# Patient Record
Sex: Female | Born: 1960 | Race: White | Hispanic: No | Marital: Single | Smoking: Current every day smoker
Health system: Southern US, Community
[De-identification: ages and names within clinical notes are randomized; demographics above are authoritative.]

## PROBLEM LIST (undated history)

## (undated) HISTORY — PX: ABDOMINAL HYSTERECTOMY: SHX81

---

## 2000-03-23 ENCOUNTER — Ambulatory Visit (HOSPITAL_COMMUNITY): Admission: RE | Admit: 2000-03-23 | Discharge: 2000-03-23 | Payer: Self-pay | Admitting: *Deleted

## 2011-05-14 ENCOUNTER — Other Ambulatory Visit (HOSPITAL_COMMUNITY)
Admission: RE | Admit: 2011-05-14 | Discharge: 2011-05-14 | Disposition: A | Discharge status: Home / Self Care | Payer: Self-pay | Source: Ambulatory Visit | Attending: Internal Medicine | Admitting: Internal Medicine

## 2011-05-14 ENCOUNTER — Ambulatory Visit (INDEPENDENT_AMBULATORY_CARE_PROVIDER_SITE_OTHER): Payer: Self-pay | Admitting: Internal Medicine

## 2011-05-14 ENCOUNTER — Encounter: Payer: Self-pay | Admitting: Internal Medicine

## 2011-05-14 DIAGNOSIS — N760 Acute vaginitis: Secondary | ICD-10-CM | POA: Insufficient documentation

## 2011-05-14 DIAGNOSIS — Z13220 Encounter for screening for lipoid disorders: Secondary | ICD-10-CM

## 2011-05-14 DIAGNOSIS — R634 Abnormal weight loss: Secondary | ICD-10-CM

## 2011-05-14 DIAGNOSIS — R300 Dysuria: Secondary | ICD-10-CM

## 2011-05-14 DIAGNOSIS — Z01419 Encounter for gynecological examination (general) (routine) without abnormal findings: Secondary | ICD-10-CM | POA: Insufficient documentation

## 2011-05-14 DIAGNOSIS — Z113 Encounter for screening for infections with a predominantly sexual mode of transmission: Secondary | ICD-10-CM

## 2011-05-14 DIAGNOSIS — Z124 Encounter for screening for malignant neoplasm of cervix: Secondary | ICD-10-CM

## 2011-05-14 LAB — POCT URINALYSIS DIPSTICK
Protein, UA: NEGATIVE
Spec Grav, UA: 1.025
Urobilinogen, UA: 0.2
pH, UA: 7
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
## 2011-05-14 NOTE — Patient Instructions (Signed)
Return on Monday for your bloodwork  Please consider quitting smoking again, I have printed some helpful information for  you to review.

## 2011-05-16 ENCOUNTER — Encounter: Payer: Self-pay | Admitting: Internal Medicine

## 2011-05-16 DIAGNOSIS — Z113 Encounter for screening for infections with a predominantly sexual mode of transmission: Secondary | ICD-10-CM | POA: Insufficient documentation

## 2011-05-16 DIAGNOSIS — Z124 Encounter for screening for malignant neoplasm of cervix: Secondary | ICD-10-CM | POA: Insufficient documentation

## 2011-05-16 NOTE — Assessment & Plan Note (Addendum)
Done today at patient's request. She has had a supracervical  hysterectomy
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
## 2011-05-17 ENCOUNTER — Other Ambulatory Visit (INDEPENDENT_AMBULATORY_CARE_PROVIDER_SITE_OTHER): Payer: Self-pay | Admitting: *Deleted

## 2011-05-17 DIAGNOSIS — R634 Abnormal weight loss: Secondary | ICD-10-CM

## 2011-05-17 DIAGNOSIS — Z13220 Encounter for screening for lipoid disorders: Secondary | ICD-10-CM

## 2011-05-17 NOTE — Progress Notes (Signed)
Addended by: Jobie Quaker on: 05/17/2011 02:32 PM   Modules accepted: Orders

## 2011-05-17 NOTE — Progress Notes (Signed)
Addended by: Jobie Quaker on: 05/17/2011 08:57 AM   Modules accepted: Orders

## 2011-05-17 NOTE — Progress Notes (Signed)
Addended by: Jobie Quaker on: 05/17/2011 08:52 AM   Modules accepted: Orders

## 2011-05-19 LAB — HIV ANTIBODY (ROUTINE TESTING W REFLEX): HIV: NONREACTIVE

## 2011-05-20 LAB — URINE CULTURE: Colony Count: 1000

## 2011-05-26 ENCOUNTER — Encounter: Payer: Self-pay | Admitting: Internal Medicine

## 2014-10-06 ENCOUNTER — Emergency Department: Payer: Self-pay | Admitting: Emergency Medicine

## 2015-12-09 IMAGING — CT CT ABD-PELV W/O CM
3 of 4 series · 10 of 46 positions shown, 17 images · non-contrast
Comparison: None.

CLINICAL DATA: Pt c/o n/v/d since this AM. c/o left lower abd pain.
Denies dysuria.Drank about "6 little airplane bottles" of alcohol
last night, left flank pain, Diana Mihaela sx, and denies h/o kidney stones

EXAM:
CT ABDOMEN AND PELVIS WITHOUT CONTRAST
TECHNIQUE: Multidetector CT imaging of the abdomen and pelvis was performed
following the standard protocol without IV contrast.

[Series 4: lung · axial · 0.74mm/px · z∈[-661,-561]mm · 6 of 29 slices shown, 11 images]
[im 5/29  soft-tissue]
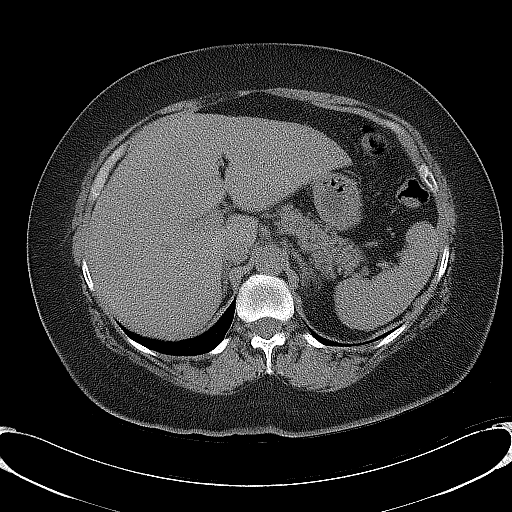
[im 5/29  bone]
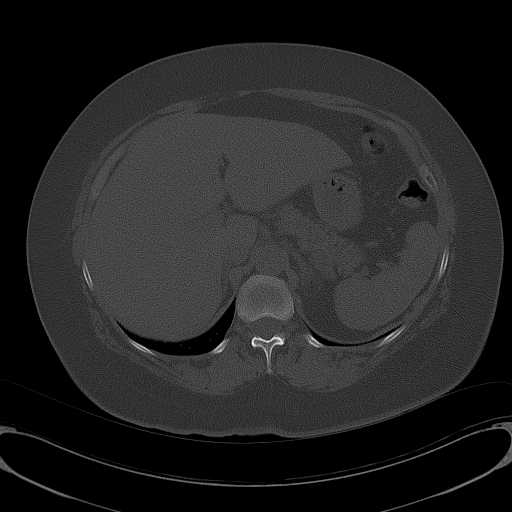
[im 9/29  soft-tissue]
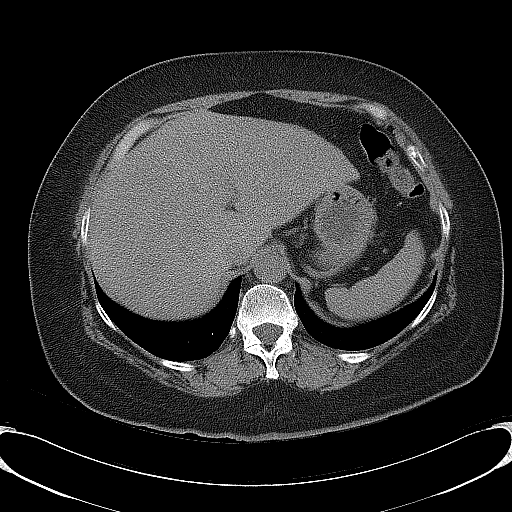
[im 13/29  soft-tissue]
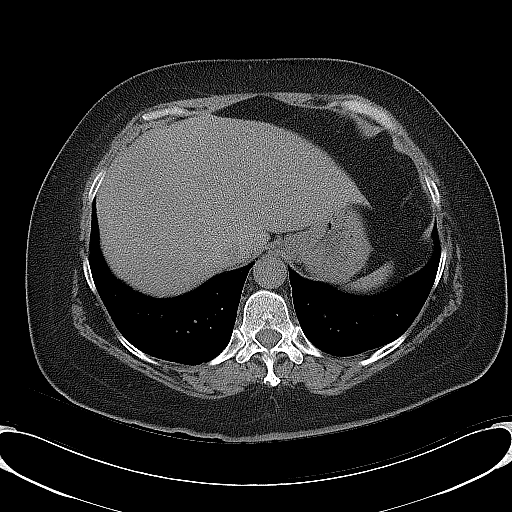
[im 13/29  lung]
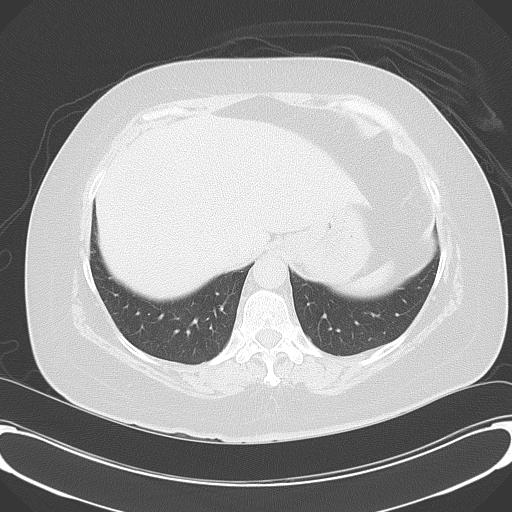
[im 17/29  soft-tissue]
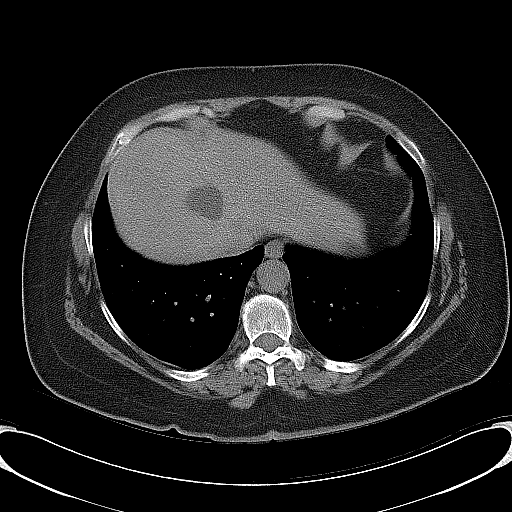
[im 17/29  lung]
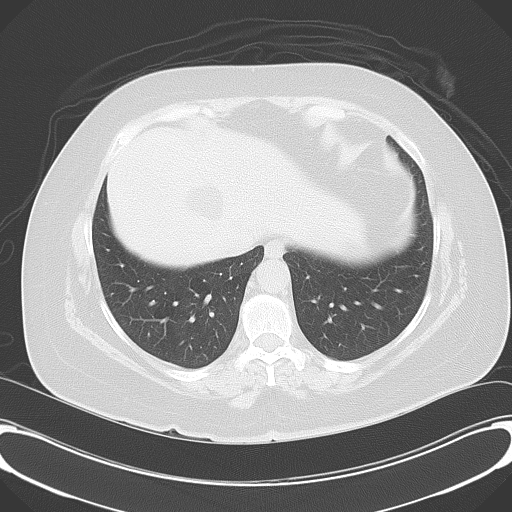
[im 21/29  soft-tissue]
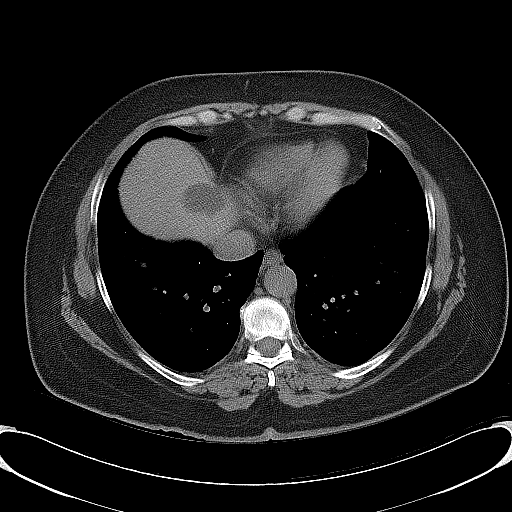
[im 21/29  lung]
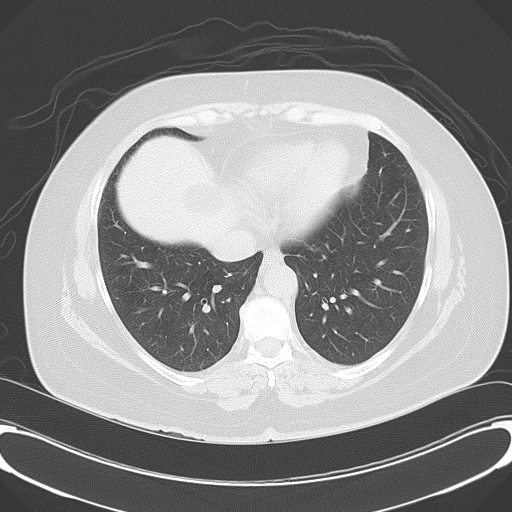
[im 25/29  soft-tissue]
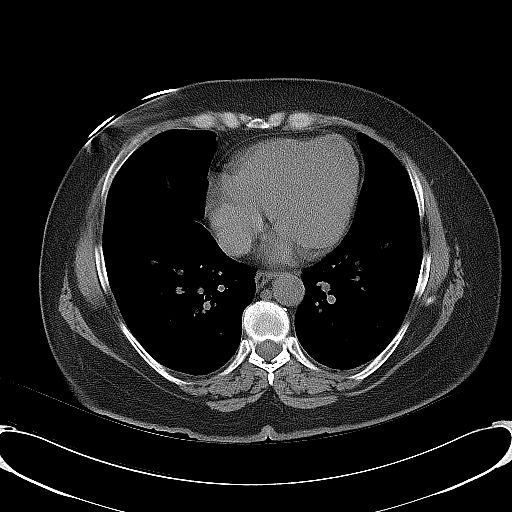
[im 25/29  lung]
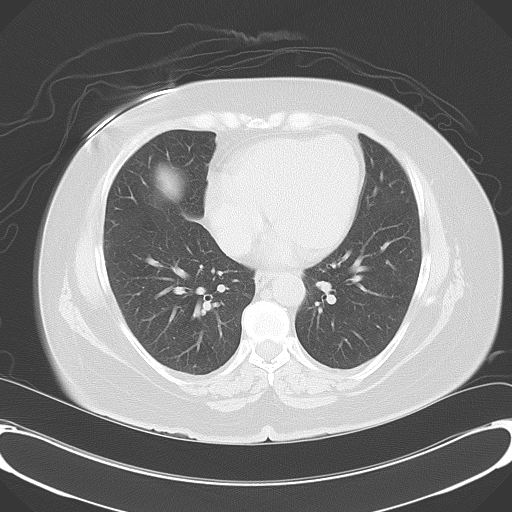

[Series 5: coronal · coronal · 0.73mm/px · 3 of 141 slices shown, 4 images]
[im 47/141  soft-tissue]
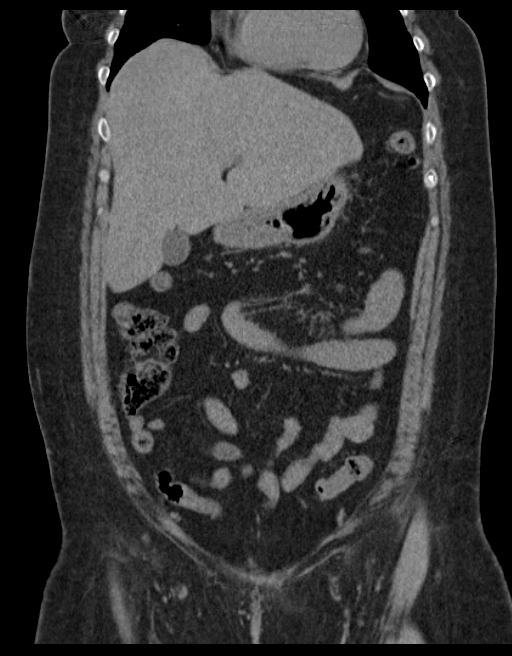
[im 63/141  soft-tissue]
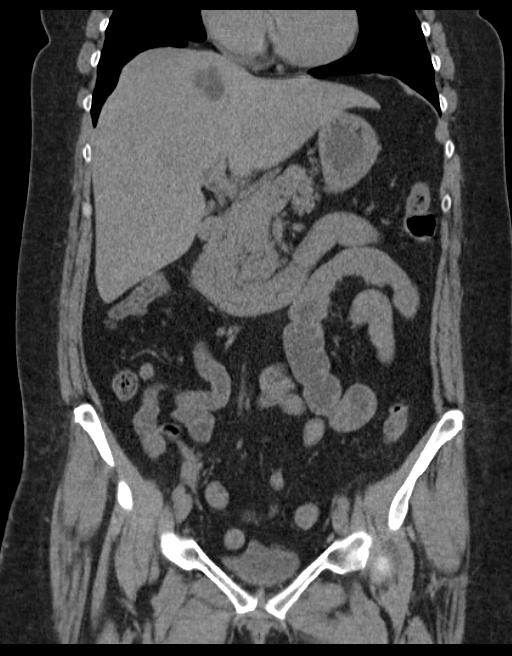
[im 63/141  bone]
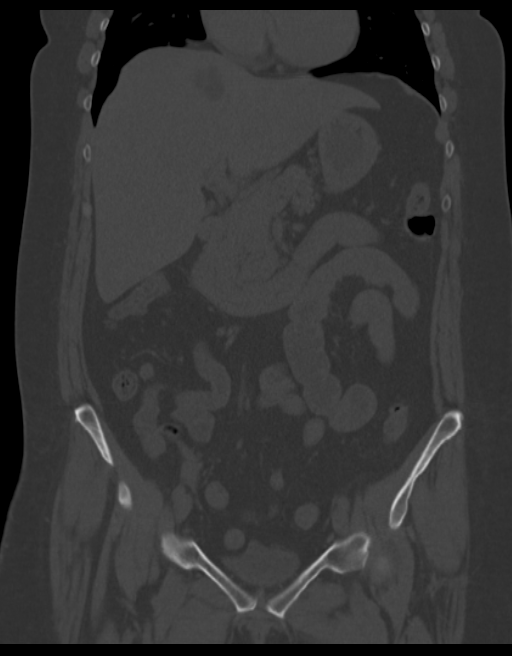
[im 78/141  soft-tissue]
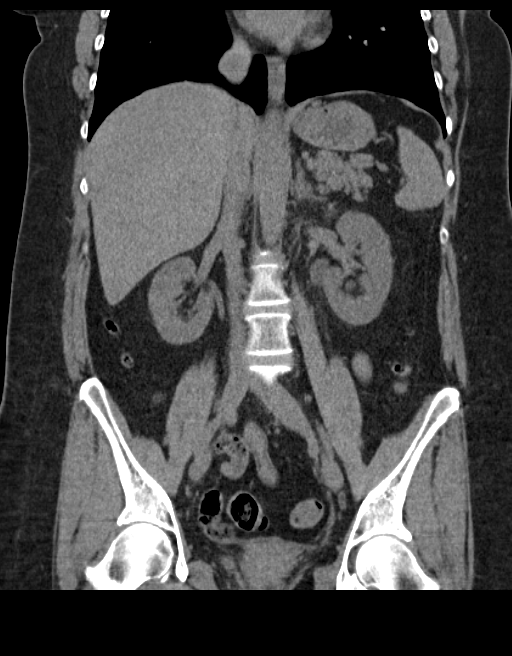

[Series 6: sagittal · sagittal · 0.72mm/px · 1 of 183 slices shown, 2 images]
[im 61/183  soft-tissue]
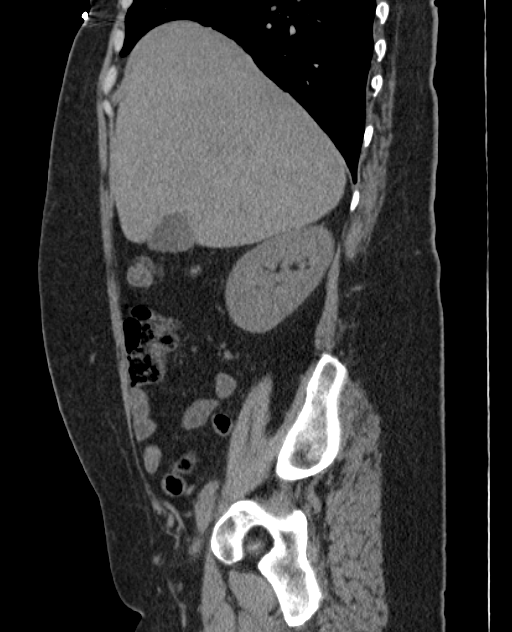
[im 61/183  bone]
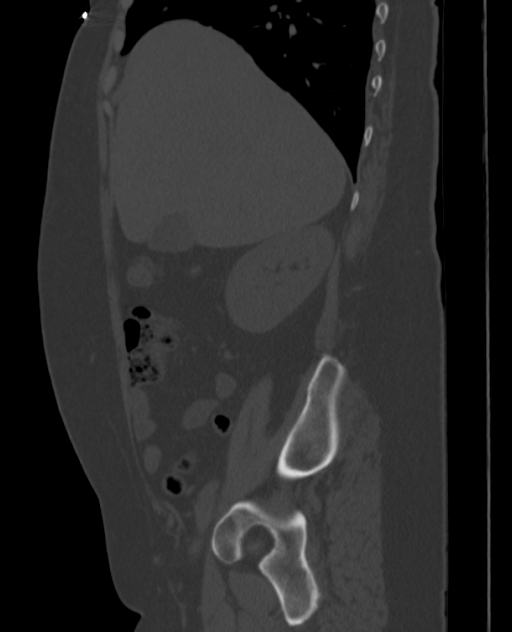

[10 of 46 positions shown; findings below may reference images not displayed]

FINDINGS: 3 mm stone lies in the proximal left ureter causing mild left
hydronephrosis and left perinephric stranding. No other left
ureteral stone. No intrarenal stones on either side. No renal
masses. Right ureter normal course and caliber. Bladder is
unremarkable.

Clear lung bases.  Heart normal size.

4 cm cyst at the dome of the medial segment of the left lobe of the
liver. Liver shows diffuse fatty infiltration. No other liver mass
or focal lesion.

Normal spleen, gallbladder pancreas. No bile duct dilation. No
adrenal masses.

Uterus surgically absent.  No pelvic masses.

No adenopathy.  There are no abnormal fluid collections.

There are few colonic diverticula. No diverticulitis. Colon
otherwise unremarkable. Normal small bowel. Normal appendix.

No significant bony abnormality.
IMPRESSION: 1. 3 mm stone in the proximal left ureter causing mild left
hydronephrosis.
2. No other acute findings.  No intrarenal stones.
3. Hepatic steatosis.  4 cm left liver lobe cyst.

## 2018-04-17 ENCOUNTER — Other Ambulatory Visit: Payer: Self-pay

## 2018-04-17 ENCOUNTER — Emergency Department: Payer: BLUE CROSS/BLUE SHIELD

## 2018-04-17 ENCOUNTER — Emergency Department
Admission: EM | Admit: 2018-04-17 | Discharge: 2018-04-17 | Disposition: A | Discharge status: Home / Self Care | Payer: BLUE CROSS/BLUE SHIELD | Attending: Emergency Medicine | Admitting: Emergency Medicine

## 2018-04-17 ENCOUNTER — Encounter: Payer: Self-pay | Admitting: Emergency Medicine

## 2018-04-17 DIAGNOSIS — Z79899 Other long term (current) drug therapy: Secondary | ICD-10-CM | POA: Diagnosis not present

## 2018-04-17 DIAGNOSIS — F17210 Nicotine dependence, cigarettes, uncomplicated: Secondary | ICD-10-CM | POA: Insufficient documentation

## 2018-04-17 DIAGNOSIS — N23 Unspecified renal colic: Secondary | ICD-10-CM | POA: Insufficient documentation

## 2018-04-17 DIAGNOSIS — R1032 Left lower quadrant pain: Secondary | ICD-10-CM | POA: Diagnosis present

## 2018-04-17 LAB — URINALYSIS, COMPLETE (UACMP) WITH MICROSCOPIC
BILIRUBIN URINE: NEGATIVE
GLUCOSE, UA: NEGATIVE mg/dL
Ketones, ur: NEGATIVE mg/dL
LEUKOCYTES UA: NEGATIVE
NITRITE: NEGATIVE
PROTEIN: NEGATIVE mg/dL
Specific Gravity, Urine: 1.023 (ref 1.005–1.030)
pH: 5 (ref 5.0–8.0)

## 2018-04-17 LAB — COMPREHENSIVE METABOLIC PANEL
ALBUMIN: 3.9 g/dL (ref 3.5–5.0)
ALT: 21 U/L (ref 0–44)
AST: 22 U/L (ref 15–41)
Alkaline Phosphatase: 76 U/L (ref 38–126)
Anion gap: 10 (ref 5–15)
BILIRUBIN TOTAL: 0.7 mg/dL (ref 0.3–1.2)
BUN: 21 mg/dL — ABNORMAL HIGH (ref 6–20)
CO2: 21 mmol/L — ABNORMAL LOW (ref 22–32)
Calcium: 9.2 mg/dL (ref 8.9–10.3)
Chloride: 112 mmol/L — ABNORMAL HIGH (ref 98–111)
Creatinine, Ser: 0.7 mg/dL (ref 0.44–1.00)
GFR calc Af Amer: >60 mL/min (ref >60)
GLUCOSE: 149 mg/dL — AB (ref 70–99)
POTASSIUM: 4 mmol/L (ref 3.5–5.1)
Sodium: 143 mmol/L (ref 135–145)
TOTAL PROTEIN: 6.7 g/dL (ref 6.5–8.1)

## 2018-04-17 LAB — CBC
HEMATOCRIT: 44.1 % (ref 35.0–47.0)
Hemoglobin: 15.4 g/dL (ref 12.0–16.0)
MCH: 33.1 pg (ref 26.0–34.0)
MCHC: 35 g/dL (ref 32.0–36.0)
MCV: 94.4 fL (ref 80.0–100.0)
Platelets: 188 10*3/uL (ref 150–440)
RBC: 4.67 MIL/uL (ref 3.80–5.20)
RDW: 12.7 % (ref 11.5–14.5)
WBC: 8.3 10*3/uL (ref 3.6–11.0)

## 2018-04-17 LAB — LIPASE, BLOOD: Lipase: 33 U/L (ref 11–51)

## 2018-04-17 MED ORDER — OXYCODONE-ACETAMINOPHEN 5-325 MG PO TABS: 1.0000 | ORAL_TABLET | ORAL | 0 refills | Status: AC | PRN

## 2018-04-17 MED ORDER — FENTANYL CITRATE (PF) 100 MCG/2ML IJ SOLN
50.0000 ug | Freq: Once | INTRAMUSCULAR | Status: AC
  Administered 2018-04-17: 50 ug via INTRAVENOUS
  Filled 2018-04-17: qty 2

## 2018-04-17 MED ORDER — IBUPROFEN 200 MG PO TABS: 600.0000 mg | ORAL_TABLET | Freq: Four times a day (QID) | ORAL | 0 refills | Status: AC | PRN

## 2018-04-17 MED ORDER — ONDANSETRON HCL 4 MG/2ML IJ SOLN
4.0000 mg | Freq: Once | INTRAMUSCULAR | Status: AC
  Administered 2018-04-17: 4 mg via INTRAVENOUS
  Filled 2018-04-17: qty 2

## 2018-04-17 MED ORDER — TAMSULOSIN HCL 0.4 MG PO CAPS: 0.4000 mg | ORAL_CAPSULE | Freq: Every day | ORAL | 0 refills | Status: AC

## 2018-04-17 MED ORDER — KETOROLAC TROMETHAMINE 30 MG/ML IJ SOLN
15.0000 mg | Freq: Once | INTRAMUSCULAR | Status: AC
  Administered 2018-04-17: 15 mg via INTRAVENOUS
  Filled 2018-04-17: qty 1

## 2018-04-17 MED ORDER — ONDANSETRON HCL 4 MG PO TABS: 4.0000 mg | ORAL_TABLET | Freq: Three times a day (TID) | ORAL | 0 refills | Status: AC | PRN

## 2018-04-17 NOTE — ED Notes (Signed)
Pt c/o left lower quad pain that started yesterday and got increasingly worse this am - pt in room actively vomiting in toilet - vomitus is thin/yellow in color in large amount - denies diarrhea or change in bowel habits - reports that she has had a kidney stone before and feels like this is the same type of pain

## 2018-04-17 NOTE — ED Provider Notes (Addendum)
[facility] Emergency Department Provider Note  ____________________________________________   I have reviewed the triage vital signs and the nursing notes. Where available I have reviewed prior notes and, if possible and indicated, outside hospital notes.    HISTORY  Chief Complaint Abdominal Pain    HPI Caitlin Scott is a 57 y.o. female with a history she states of one kidney stone 3 years ago, presents today complaining with left-sided flank pain range her groin. No hematuria no fever no chills. Began yesterday, somewhat abruptly, worsening since that time. Patient states is very uncomfortable. The sharp discomfort. Nothing makes it better and nothing makes it worse no other alleviating or vomiting symptoms has been vomiting.      History reviewed. No pertinent past medical history.  Patient Active Problem List   Diagnosis Date Noted  . Screening for cervical cancer 05/16/2011  . Screening for venereal disease 05/16/2011    Past Surgical History:  Procedure Laterality Date  . ABDOMINAL HYSTERECTOMY      Prior to Admission medications   Medication Sig Start Date End Date Taking? Authorizing Provider  ibuprofen (ADVIL,MOTRIN) 200 MG tablet Take 400 mg by mouth daily as needed for mild pain or moderate pain.   Yes [provider]  naproxen sodium (ALEVE) 220 MG tablet Take 440 mg by mouth daily as needed (for pain).   Yes [provider]    Allergies Clindamycin/lincomycin and Penicillins  Family History  Problem Relation Age of Onset  . Cancer Mother        Lung Cancer  . Cancer Father 59       Prostate CA  . Stroke Brother 39       cerebral hemorrhage, smoker, cocaine  . Diabetes Son   . Cancer Maternal Grandfather        colon Ca  . Heart disease Paternal Grandmother   . Heart disease Paternal Grandfather     Social History Social History   Tobacco Use  . Smoking status: Current Every Day Smoker   Packs/day: 0.50    Types: Cigarettes  . Smokeless tobacco: Never Used  Substance Use Topics  . Alcohol use: Yes  . Drug use: No    Review of Systems Constitutional: No fever/chills Eyes: No visual changes. ENT: No sore throat. No stiff neck no neck pain Cardiovascular: Denies chest pain. Respiratory: Denies shortness of breath. Gastrointestinal:   No+  vomiting.  No diarrhea.  No constipation. Genitourinary: Negative for dysuria. Musculoskeletal: Negative lower extremity swelling Skin: Negative for rash. Neurological: Negative for severe headaches, focal weakness or numbness.   ____________________________________________   PHYSICAL EXAM:  VITAL SIGNS: ED Triage Vitals  Enc Vitals Group     BP 04/17/18 0703 140/67     Pulse Rate 04/17/18 0703 85     Resp 04/17/18 0703 18     Temp 04/17/18 0703 98.2 F (36.8 C)     Temp Source 04/17/18 0703 Oral     SpO2 04/17/18 0703 98 %     Weight 04/17/18 0704 200 lb (90.7 kg)     Height 04/17/18 0704 5\' 6"  (1.676 m)     Head Circumference --      Peak Flow --      Pain Score 04/17/18 0704 10     Pain Loc --      Pain Edu? --      Excl. in GC? --     Constitutional: Alert and oriented. a shin nontoxic in appearance but  appears uncomfortable Eyes: Conjunctivae are normal Head: Atraumatic HEENT: No congestion/rhinnorhea. Mucous membranes are moist.  Oropharynx non-erythematous Neck:   Nontender with no meningismus, no masses, no stridor Cardiovascular: Normal rate, regular rhythm. Grossly normal heart sounds.  Good peripheral circulation. Respiratory: Normal respiratory effort.  No retractions. Lungs CTAB. Abdominal: Soft and nontender. No distention. No guarding no rebound Back:  There is no focal tenderness or step off.  there is no midline tenderness there are no lesions noted. there is leftCVA tenderness Musculoskeletal: No lower extremity tenderness, no upper extremity tenderness. No joint effusions, no DVT signs strong  distal pulses no edema Neurologic:  Normal speech and language. No gross focal neurologic deficits are appreciated.  Skin:  Skin is warm, dry and intact. No rash noted. Psychiatric: Mood and affect are normal. Speech and behavior are normal.  ____________________________________________   LABS (all labs ordered are listed, but only abnormal results are displayed)  Labs Reviewed  LIPASE, BLOOD  COMPREHENSIVE METABOLIC PANEL  CBC  URINALYSIS, COMPLETE (UACMP) WITH MICROSCOPIC    Pertinent labs  results that were available during my care of the patient were reviewed by me and considered in my medical decision making (see chart for details). ____________________________________________  EKG  I personally interpreted any EKGs ordered by me or triage  ____________________________________________  RADIOLOGY  Pertinent labs & imaging results that were available during my care of the patient were reviewed by me and considered in my medical decision making (see chart for details). If possible, patient and/or family made aware of any abnormal findings.  No results found. ____________________________________________    PROCEDURES  Procedure(s) performed: None  Procedures  Critical Care performed: None  ____________________________________________   INITIAL IMPRESSION / ASSESSMENT AND PLAN / ED COURSE  Pertinent labs & imaging results that were available during my care of the patient were reviewed by me and considered in my medical decision making (see chart for details).  patient here with left-sided flank pain, very uncomfortable we are giving her pain medication, I have tried to advise that she breathes and slow easy respirations as this is something of a dramatic presentation and I think that will help control her pain. I will obtain a CT scan given how much discomfort she is complaining of to ensure that no other pathology is present but most likely this is a kidney stone.  We are giving her pain medications and antinausea medications  ----------------------------------------- 8:34 AM on 04/17/2018 -----------------------------------------  patient has a small kidney stone, respiratory workup thus far as reassuring we are awaiting a urinalysis give her Toradol her pain is nearly controlled however after fentanyl.    ____________________________________________   FINAL CLINICAL IMPRESSION(S) / ED DIAGNOSES  Final diagnoses:  None      This chart was dictated using voice recognition software.  Despite best efforts to proofread,  errors can occur which can change meaning.        Jeanmarie Plant, MD 04/17/18 9604    Jeanmarie Plant, MD 04/17/18 [phone]

## 2018-04-17 NOTE — ED Triage Notes (Signed)
Lower R abdominal pain began yesterday. Denies diarrhea or dysuria.

## 2019-06-20 IMAGING — CT CT RENAL STONE PROTOCOL
2 of 4 series · 16 of 46 positions shown, 18 images · non-contrast
Comparison: None.

CLINICAL DATA: Left flank pain

EXAM:
CT ABDOMEN AND PELVIS WITHOUT CONTRAST
TECHNIQUE: Multidetector CT imaging of the abdomen and pelvis was performed
following the standard protocol without IV contrast.

[Series 2: stone full standard · axial · 0.67mm/px · z∈[-1120,-695]mm · 13 of 95 slices shown, 15 images]
[im 5/95  soft-tissue]
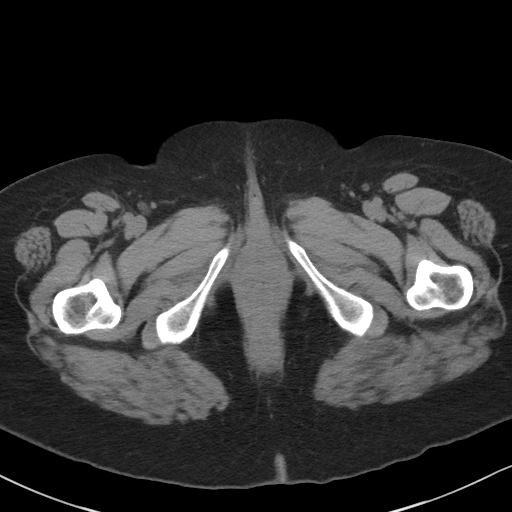
[im 5/95  bone]
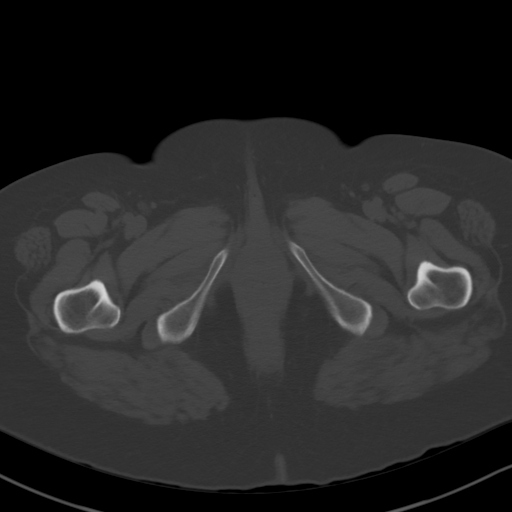
[im 13/95  soft-tissue]
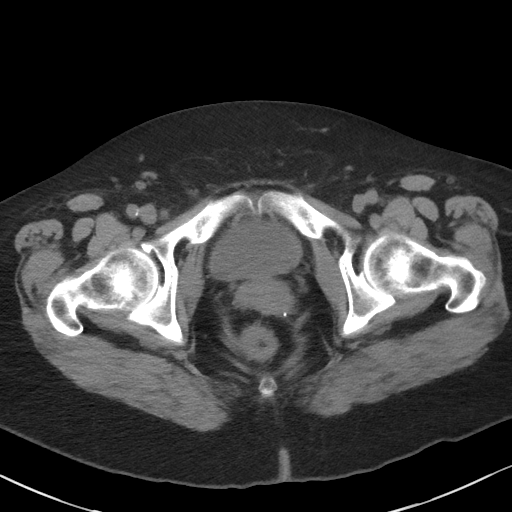
[im 21/95  soft-tissue]
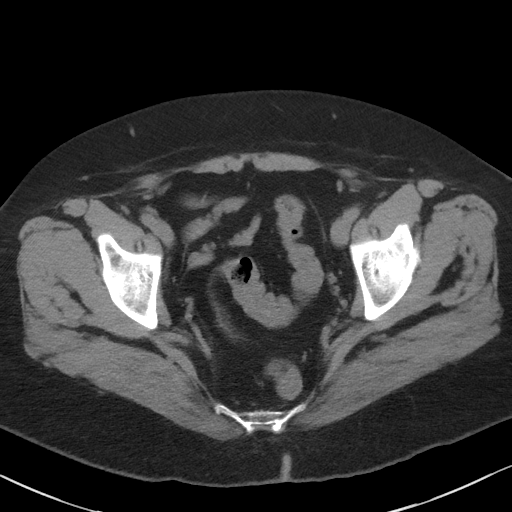
[im 25/95  soft-tissue]
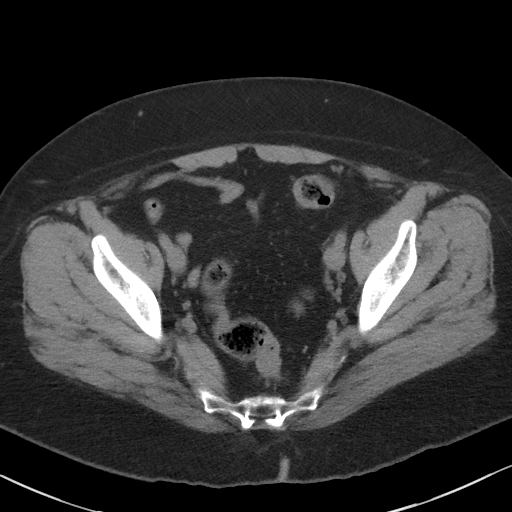
[im 33/95  soft-tissue]
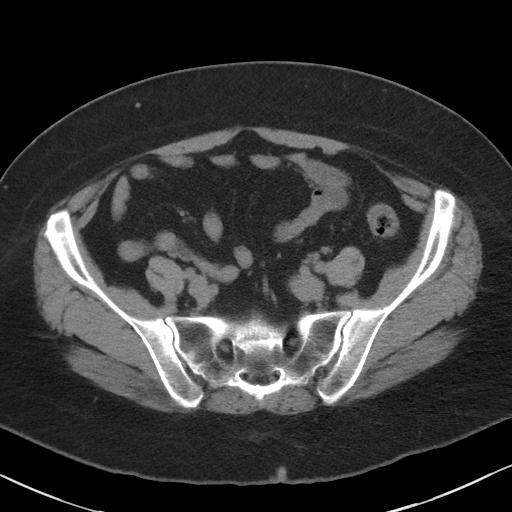
[im 41/95  soft-tissue]
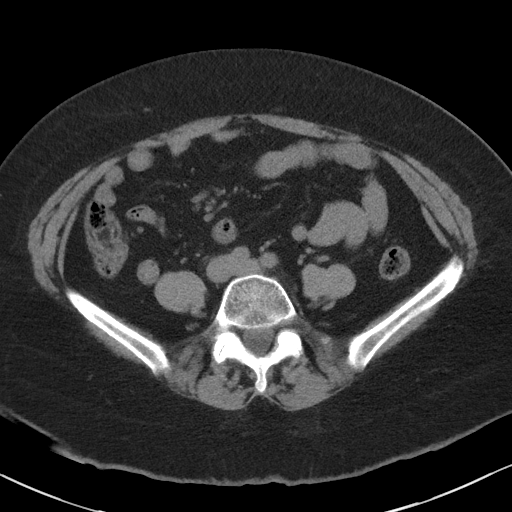
[im 50/95  soft-tissue]
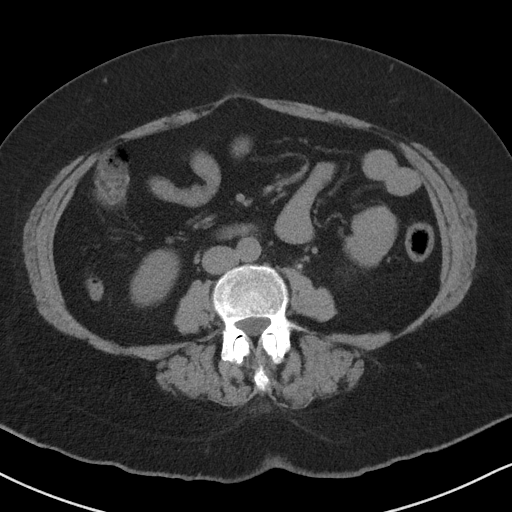
[im 54/95  soft-tissue]
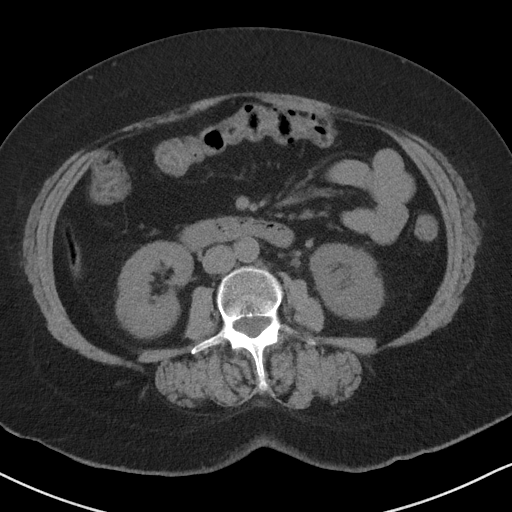
[im 62/95  soft-tissue]
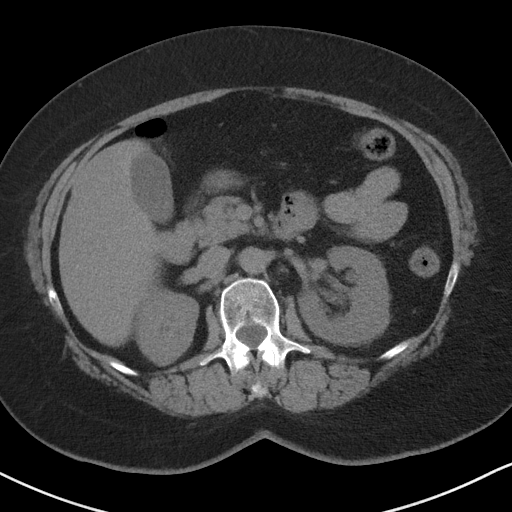
[im 62/95  bone]
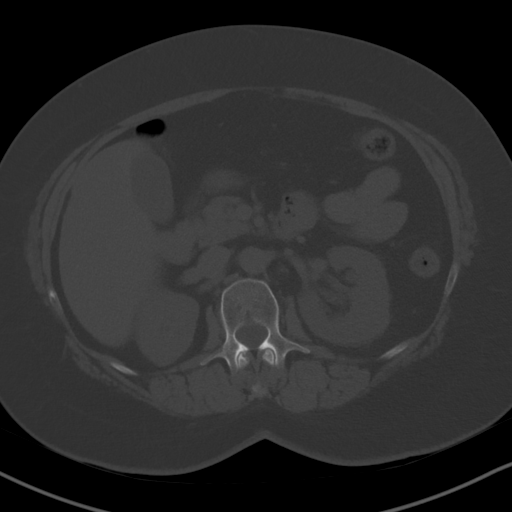
[im 70/95  soft-tissue]
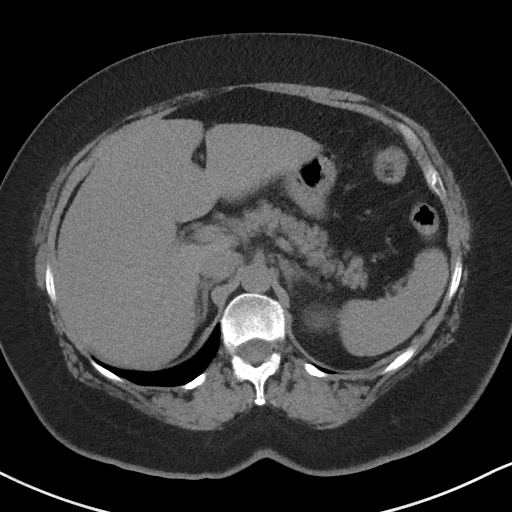
[im 74/95  soft-tissue]
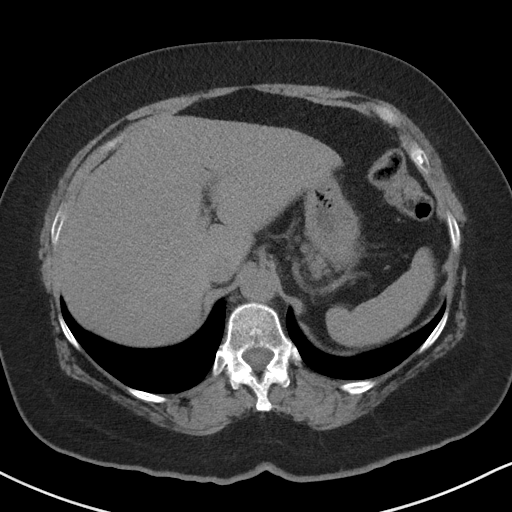
[im 82/95  soft-tissue]
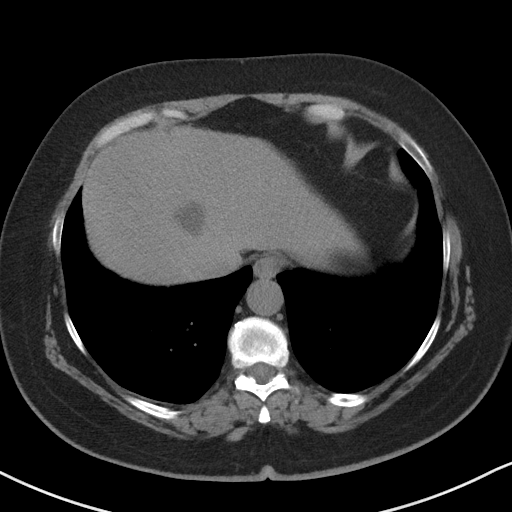
[im 90/95  soft-tissue]
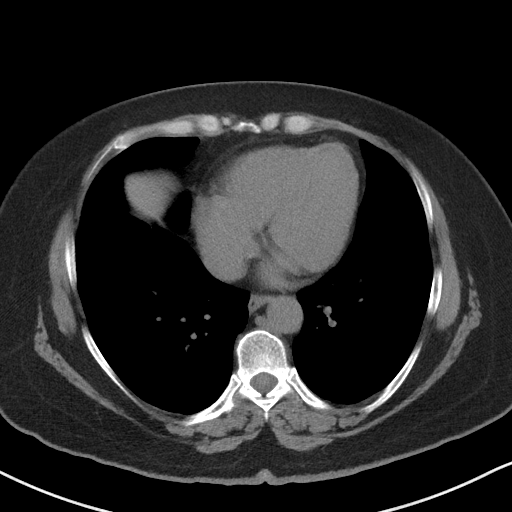

[Series 5: coronal · coronal · 0.69mm/px · 3 of 149 slices shown]
[im 50/149  soft-tissue]
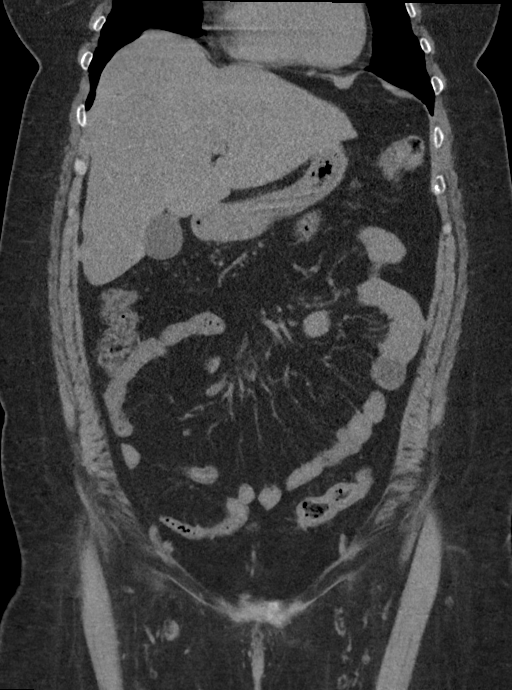
[im 66/149  soft-tissue]
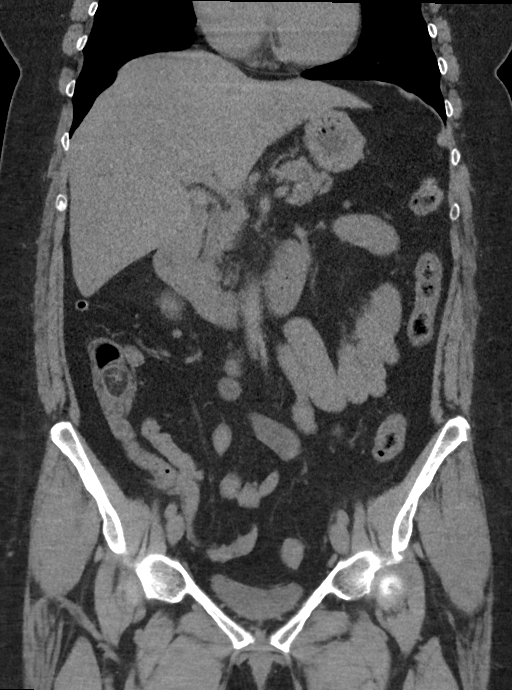
[im 83/149  soft-tissue]
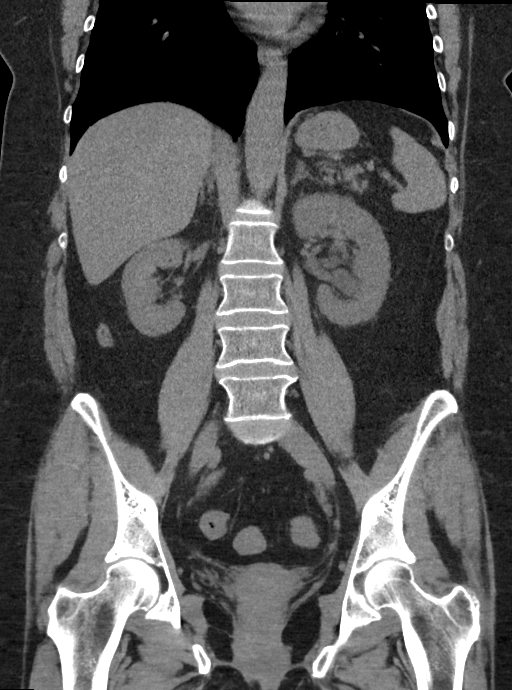

[16 of 46 positions shown; findings below may reference images not displayed]

FINDINGS: Lower chest: Lung bases are clear. No effusions. Heart is normal
size.

Hepatobiliary: Diffuse fatty infiltration of the liver. 4 cm
low-density lesion in the dome of the liver, likely cyst.
Gallbladder unremarkable.

Pancreas: No focal abnormality or ductal dilatation.

Spleen: No focal abnormality.  Normal size.

Adrenals/Urinary Tract: Mild left hydronephrosis. Punctate 1-2 mm
calcification in the proximal left ureter. No additional renal or
ureteral stones. Adrenal glands and urinary bladder unremarkable.

Stomach/Bowel: Appendix is visualized and is normal. Stomach, large
and small bowel grossly unremarkable.

Vascular/Lymphatic: No evidence of aneurysm or adenopathy.

Reproductive: Prior hysterectomy.  No adnexal masses.

Other: No free fluid or free air.

Musculoskeletal: No acute bony abnormality.
IMPRESSION: Punctate 1-2 mm proximal left ureteral stone with mild left
hydronephrosis.

Fatty infiltration of the liver.

## 2022-12-19 DIAGNOSIS — R030 Elevated blood-pressure reading, without diagnosis of hypertension: Secondary | ICD-10-CM | POA: Diagnosis not present

## 2022-12-19 DIAGNOSIS — N200 Calculus of kidney: Secondary | ICD-10-CM | POA: Diagnosis not present

## 2022-12-19 DIAGNOSIS — R1032 Left lower quadrant pain: Secondary | ICD-10-CM | POA: Diagnosis not present

## 2022-12-19 DIAGNOSIS — N201 Calculus of ureter: Secondary | ICD-10-CM | POA: Diagnosis not present

## 2022-12-19 DIAGNOSIS — K5730 Diverticulosis of large intestine without perforation or abscess without bleeding: Secondary | ICD-10-CM | POA: Diagnosis not present

## 2022-12-19 DIAGNOSIS — R109 Unspecified abdominal pain: Secondary | ICD-10-CM | POA: Diagnosis not present

## 2022-12-19 DIAGNOSIS — R102 Pelvic and perineal pain: Secondary | ICD-10-CM | POA: Diagnosis not present

## 2022-12-19 DIAGNOSIS — R112 Nausea with vomiting, unspecified: Secondary | ICD-10-CM | POA: Diagnosis not present

## 2022-12-19 DIAGNOSIS — R7309 Other abnormal glucose: Secondary | ICD-10-CM | POA: Diagnosis not present

## 2022-12-19 DIAGNOSIS — N132 Hydronephrosis with renal and ureteral calculous obstruction: Secondary | ICD-10-CM | POA: Diagnosis not present
